# Patient Record
Sex: Female | Born: 1975 | Hispanic: Yes | Marital: Married | Smoking: Never smoker
Health system: Southern US, Community
[De-identification: ages and names within clinical notes are randomized; demographics above are authoritative.]

## PROBLEM LIST (undated history)

## (undated) DIAGNOSIS — E079 Disorder of thyroid, unspecified: Secondary | ICD-10-CM

## (undated) HISTORY — PX: OOPHORECTOMY: SHX86

## (undated) HISTORY — PX: KNEE SURGERY: SHX244

## (undated) HISTORY — PX: CYST EXCISION: SHX5701

---

## 2012-08-11 ENCOUNTER — Emergency Department
Admission: EM | Admit: 2012-08-11 | Discharge: 2012-08-11 | Disposition: A | Discharge status: Home / Self Care | Payer: 59 | Source: Home / Self Care | Attending: Family Medicine | Admitting: Family Medicine

## 2012-08-11 ENCOUNTER — Encounter: Payer: Self-pay | Admitting: Emergency Medicine

## 2012-08-11 DIAGNOSIS — J111 Influenza due to unidentified influenza virus with other respiratory manifestations: Secondary | ICD-10-CM

## 2012-08-11 MED ORDER — OSELTAMIVIR PHOSPHATE 75 MG PO CAPS: 75.0000 mg | ORAL_CAPSULE | Freq: Two times a day (BID) | ORAL | Status: DC

## 2012-08-11 MED ORDER — BENZONATATE 200 MG PO CAPS: 200.0000 mg | ORAL_CAPSULE | Freq: Every day | ORAL | Status: DC

## 2012-08-11 NOTE — ED Notes (Signed)
Sore throat, fever, chills, cough x 4 days

## 2012-08-11 NOTE — ED Provider Notes (Signed)
History     CSN: [id number]  Arrival date & time 08/11/12  7829   First MD Initiated Contact with Patient 08/11/12 [phone]      Chief Complaint  Patient presents with  . Sore Throat      HPI Comments: Patient complains of sore throat, fever, chills, cough for 3 days.  She has not had a flu shot this season.  The history is provided by the patient.    History reviewed. No pertinent past medical history.  History reviewed. No pertinent past surgical history.  Family History  Problem Relation Age of Onset  . Diabetes Mother   . Diabetes Father     History  Substance Use Topics  . Smoking status: Never Smoker   . Smokeless tobacco: Not on file  . Alcohol Use: No    OB History    Grav Para Term Preterm Abortions TAB SAB Ect Mult Living                  Review of Systems + sore throat + cough No pleuritic pain No wheezing + nasal congestion + post-nasal drainage No sinus pain/pressure No itchy/red eyes ? earache No hemoptysis No SOB No fever, + chills No nausea No vomiting No abdominal pain No diarrhea No urinary symptoms No skin rashes + fatigue No myalgias + headache Used OTC meds without relief  Allergies  Review of patient's allergies indicates no known allergies.  Home Medications   Current Outpatient Rx  Name  Route  Sig  Dispense  Refill  . PSEUDOEPH-DOXYLAMINE-DM-APAP 60-7.01-09-999 MG/30ML PO LIQD   Oral   Take by mouth.         . BENZONATATE 200 MG PO CAPS   Oral   Take 1 capsule (200 mg total) by mouth at bedtime. Take as needed for cough   12 capsule   0   . OSELTAMIVIR PHOSPHATE 75 MG PO CAPS   Oral   Take 1 capsule (75 mg total) by mouth every 12 (twelve) hours.   10 capsule   0     BP 114/81  Pulse 79  Temp 98 F (36.7 C) (Oral)  Resp 16  Ht 5\' 3"  (1.6 m)  Wt 165 lb (74.844 kg)  BMI 29.23 kg/m2  SpO2 100%  Physical Exam Nursing notes and Vital Signs reviewed. Appearance:  Patient appears healthy, stated  age, and in no acute distress Eyes:  Pupils are equal, round, and reactive to light and accomodation.  Extraocular movement is intact.  Conjunctivae are not inflamed  Ears:  Canals normal.  Tympanic membranes normal.  Nose:  Mildly congested turbinates.  No sinus tenderness.   Pharynx:  Normal Neck:  Supple.  Slightly enlarged shotty posterior nodes are palpated bilaterally  Lungs:  Clear to auscultation.  Breath sounds are equal.  Heart:  Regular rate and rhythm without murmurs, rubs, or gallops.  Abdomen:  Nontender without masses or hepatosplenomegaly.  Bowel sounds are present.  No CVA or flank tenderness.  Extremities:  No edema.  No calf tenderness Skin:  No rash present.   ED Course  Procedures    Labs Reviewed - POCT rapid strep negative    1. Influenza-like illness       MDM  Begin Tamiflu.  Prescription written for Benzonatate [facility]) to take at bedtime for night-time cough.  Take Mucinex D (guaifenesin with decongestant) twice daily for congestion.  Increase fluid intake, rest. May use Afrin nasal spray (or generic oxymetazoline) twice daily for  about 5 days.  Also recommend using saline nasal spray several times daily and saline nasal irrigation (AYR is a common brand) Stop all antihistamines for now, and other non-prescription cough/cold preparations. May take Ibuprofen 200mg , 4 tabs every 8 hours with food for sore throat, fever, etc. Recommend flu shot when well. Follow-up with family doctor if not improving one week.        [name], MD 08/11/12 [phone]

## 2015-03-15 ENCOUNTER — Emergency Department
Admission: EM | Admit: 2015-03-15 | Discharge: 2015-03-15 | Disposition: A | Discharge status: Home / Self Care | Payer: 59 | Source: Home / Self Care | Attending: Family Medicine | Admitting: Family Medicine

## 2015-03-15 ENCOUNTER — Emergency Department (INDEPENDENT_AMBULATORY_CARE_PROVIDER_SITE_OTHER): Payer: 59

## 2015-03-15 DIAGNOSIS — S83412A Sprain of medial collateral ligament of left knee, initial encounter: Secondary | ICD-10-CM

## 2015-03-15 DIAGNOSIS — M25562 Pain in left knee: Secondary | ICD-10-CM

## 2015-03-15 MED ORDER — MELOXICAM 15 MG PO TABS: 15.0000 mg | ORAL_TABLET | Freq: Every day | ORAL | Status: DC

## 2015-03-15 NOTE — ED Provider Notes (Signed)
CSN: [id number]     Arrival date & time 03/15/15  1034 History   First MD Initiated Contact with Patient 03/15/15 1054     Chief Complaint  Patient presents with  . Knee Pain    left      HPI Comments: Patient complains of intermittent pain in her left knee for four months, now constant and increased for two days.  No history of injury or change in physical activities.  No improvement with ibuprofen .  Patient is a 39 y.o. female presenting with knee pain. The history is provided by the patient.  Knee Pain Location:  Knee Time since incident:  4 months Injury: no   Knee location:  L knee Pain details:    Quality:  Aching   Radiates to:  Does not radiate   Severity:  Moderate   Onset quality:  Gradual   Duration:  4 months   Timing:  Constant   Progression:  Worsening Chronicity:  New Prior injury to area:  No Relieved by:  Nothing Worsened by:  Bearing weight, flexion and extension Ineffective treatments:  NSAIDs Associated symptoms: numbness and stiffness   Associated symptoms: no back pain, no decreased ROM, no fever, no muscle weakness, no swelling and no tingling     No past medical history on file. No past surgical history on file. Family History  Problem Relation Age of Onset  . Diabetes Mother   . Diabetes Father    History  Substance Use Topics  . Smoking status: Never Smoker   . Smokeless tobacco: Not on file  . Alcohol Use: No   OB History    No data available     Review of Systems  Constitutional: Negative for fever.  Musculoskeletal: Positive for stiffness. Negative for back pain.  All other systems reviewed and are negative.   Allergies  Review of patient's allergies indicates no known allergies.  Home Medications   Prior to Admission medications   Medication Sig Start Date End Date Taking? Authorizing Provider  meloxicam (MOBIC) 15 MG tablet Take 1 tablet (15 mg total) by mouth daily. Take with food each morning 03/15/15   [name], MD   BP 123/82 mmHg  Pulse 70  Wt 169 lb (76.658 kg)  SpO2 98%  LMP 01/25/2015 Physical Exam  Constitutional: She is oriented to person, place, and time. She appears well-developed and well-nourished.  HENT:  Head: Atraumatic.  Eyes: Pupils are equal, round, and reactive to light.  Musculoskeletal:       Left knee: She exhibits bony tenderness. She exhibits normal range of motion, no swelling, no effusion, no ecchymosis, no deformity, no laceration, no erythema, normal alignment, no LCL laxity, normal patellar mobility and normal meniscus. Tenderness found. Medial joint line and MCL tenderness noted. No lateral joint line, no LCL and no patellar tendon tenderness noted.       Legs: Left knee has full range of motion.  No swelling, erythema or warmth.  Knee stable; negative McMurray test.  There is tenderness to palpation over the medial joint line and MCL.  Pain is elicited medially with valgus stress.  Neurological: She is alert and oriented to person, place, and time.  Skin: Skin is warm and dry. No rash noted.  Nursing note and vitals reviewed.   ED Course  Procedures  none  Imaging Review Dg Knee Complete 4 Views Left  03/15/2015   CLINICAL DATA:  39 year old female with intermittent left knee pain for 4 months.  Constant for the past 2 days. No injury. Initial encounter.  EXAM: LEFT KNEE - COMPLETE 4+ VIEW  COMPARISON:  None.  FINDINGS: Minimal medial tibiofemoral joint space narrowing.  No fracture or dislocation.  Questionable tiny suprapatellar joint effusion.  IMPRESSION: Minimal medial tibiofemoral joint space narrowing.  Questionable tiny suprapatellar joint effusion   Electronically Signed   By: Lacy Duverney M.D.   On: 03/15/2015 11:56     MDM   1. Medial collateral ligament sprain of knee, left, initial encounter, etiology unclear.     Dispensed crutches.  Hinged knee brace applied.  Begin Mobic 15mg  daily. Apply ice pack for 30 minutes 3 to 4 times daily until  pain decreases.  Elevate when possible.  Use crutches for 3 to 5 days.  Wear brace for about 2 to 3 weeks.  Begin range of motion and stretching exercises in about 5 days as per instruction sheet.  May take Tylenol at bedtime as needed for pain. Followup with Dr. Rodney Langton or Dr. Clementeen [facility] ([facility]) in one week.    [name], MD 03/15/15 1235

## 2015-03-15 NOTE — ED Notes (Signed)
C/O left knee pain, no injury , front of knee x 2days

## 2015-03-15 NOTE — Discharge Instructions (Signed)
Apply ice pack for 30 minutes 3 to 4 times daily until pain decreases.  Elevate when possible.  Use crutches for 3 to 5 days.  Wear brace for about 2 to 3 weeks.  Begin range of motion and stretching exercises in about 5 days as per instruction sheet.  May take Tylenol at bedtime as needed for pain.

## 2015-03-17 ENCOUNTER — Ambulatory Visit (INDEPENDENT_AMBULATORY_CARE_PROVIDER_SITE_OTHER): Payer: 59 | Admitting: Family Medicine

## 2015-03-17 ENCOUNTER — Encounter: Payer: Self-pay | Admitting: Family Medicine

## 2015-03-17 VITALS — BP 115/73 | HR 71 | Ht 63.0 in | Wt 170.0 lb

## 2015-03-17 DIAGNOSIS — M25562 Pain in left knee: Secondary | ICD-10-CM | POA: Diagnosis not present

## 2015-03-17 MED ORDER — ALPRAZOLAM 0.25 MG PO TABS: ORAL_TABLET | ORAL | Status: DC

## 2015-03-17 NOTE — Patient Instructions (Signed)
Thank you for coming in today. Get the MRI.  Return a few days after the MRI.  If you do not hear from the MRI people please call me.   Meniscus Injury, Arthroscopy Arthroscopy is a surgical procedure that involves the use of a small scope that has a camera and surgical instruments on the end (arthroscope). An arthroscope can be used to repair your meniscus injury.  LET [facility] CARE PROVIDER KNOW ABOUT:  Any allergies you have.  All medicines you are taking, including vitamins, herbs, eyedrops, creams, and over-the-counter medicines.  Any recent colds or infections you have had or currently have.  Previous problems you or members of your family have had with the use of anesthetics.  Any blood disorders or blood clotting problems you have.  Previous surgeries you have had.  Medical conditions you have. RISKS AND COMPLICATIONS Generally, this is a safe procedure. However, as with any procedure, problems can occur. Possible problems include:  Damage to nerves or blood vessels.  Excess bleeding.  Blood clots.  Infection. BEFORE THE PROCEDURE  Do not eat or drink for 6-8 hours before the procedure.  Take medicines as directed by your surgeon. Ask your surgeon about changing or stopping your regular medicines.  You may have lab tests the morning of surgery. PROCEDURE  You will be given one of the following:   A medicine that numbs the area (local anesthesia).  A medicine that makes you go to sleep (general anesthesia).  A medicine injected into your spine that numbs your body below the waist (spinal anesthesia). Most often, several small cuts (incisions) are made in the knee. The arthroscope and instruments go into the incisions to repair the damage. The torn portion of the meniscus is removed.  During this time, your surgeon may find a partial or complete tear in a cruciate ligament, such as the anterior cruciate ligament (ACL). A completely torn cruciate ligament is  reconstructed by taking tissue from another part of the body (grafting) and placing it into the injured area. This requires several larger incisions to complete the repair. Sometimes, open surgery is needed for collateral ligament injuries. If a collateral ligament is found to be injured, your surgeon may staple or suture the tear through a slightly larger incision on the side of the knee. AFTER THE PROCEDURE You will be taken to the recovery area where your progress will be monitored. When you are awake, stable, and taking fluids without complications, you will be allowed to go home. This is usually the same day. However, more extensive repairs of a ligament may require an overnight stay.  The recovery time after repairing your meniscus or ligament depends on the amount of damage to these structures. It also depends on whether or not reconstructive knee surgery was needed.   A torn or stretched ligament (ligament sprain) may take 6-8 weeks to heal. It takes about the same amount of time if your surgeon removed a torn meniscus.  A repaired meniscus may require 6-12 weeks of recovery time.  A torn ligament needing reconstructive surgery may take 6-12 months to heal fully. Document Released: 07/27/2000 Document Revised: 08/04/2013 Document Reviewed: 12/26/2012 [facility] Patient Information 2015 [place], [place]. This information is not intended to replace advice given to you by your health care provider. Make sure you discuss any questions you have with your health care provider.

## 2015-03-17 NOTE — Addendum Note (Signed)
Addended by: Rodolph Bong on: 03/17/2015 03:52 PM   Modules accepted: Level of Service

## 2015-03-17 NOTE — Progress Notes (Signed)
   Subjective:    I'm seeing this patient as a consultation for:  Dr. Cathren Harsh  CC: Left Knee Pain  HPI: Patient is a 4 month history of intermittent left knee pain. Pain worsened about a week ago without injury. Pain is located in the medial aspect of the knee is associated with intermittent swelling. She was seen in urgent care on the second where x-rays showed mild possible degenerative changes along the medial aspect of the knee. She notes popping of the knee but denies any locking or catching. She was given brace crutches and meloxicam which have helped. No fevers chills nausea vomiting diarrhea or radiating pain. Patient has tried home exercises/physical therapy which has not helped much.  Past medical history, Surgical history, Family history not pertinant except as noted below, Social history, Allergies, and medications have been entered into the medical record, reviewed, and no changes needed.   Review of Systems: No headache, visual changes, nausea, vomiting, diarrhea, constipation, dizziness, abdominal pain, skin rash, fevers, chills, night sweats, weight loss, swollen lymph nodes, body aches, joint swelling, muscle aches, chest pain, shortness of breath, mood changes, visual or auditory hallucinations.   Objective:    Filed Vitals:   03/17/15 1424  BP: 115/73  Pulse: 71    General: Well Developed, well nourished, and in no acute distress.  Neuro/Psych: Alert and oriented x3, extra-ocular muscles intact, able to move all 4 extremities, sensation grossly intact. Skin: Warm and dry, no rashes noted.  Respiratory: Not using accessory muscles, speaking in full sentences, trachea midline.  Cardiovascular: Pulses palpable, no extremity edema. Abdomen: Does not appear distended. MSK: Left knee normal-appearing. Range of motion 0-120 with 1+ crepitations. Tender palpation medial joint line. Stable ligamentous exam with normal valgus and varus stress with negative Lachmans and anterior  and posterior drawer testing. Medial McMurray's test is positive lateral is negative.  No results found for this or any previous visit (from the past 24 hour(s)). No results found.  Impression and Recommendations:   This case required medical decision making of moderate complexity.

## 2015-03-17 NOTE — Assessment & Plan Note (Signed)
Persistent intermittent left knee pain. This is concerning for medial meniscus injury. This patient has had long duration of symptoms with relatively normal x-rays and trial of conservative measures have failed including some home exercises, I feel is reasonable to proceed with MRI. Patient will return after MRI of knee as we performed to discuss plan

## 2015-03-21 ENCOUNTER — Ambulatory Visit (INDEPENDENT_AMBULATORY_CARE_PROVIDER_SITE_OTHER): Payer: 59

## 2015-03-21 DIAGNOSIS — M25562 Pain in left knee: Secondary | ICD-10-CM

## 2015-03-21 DIAGNOSIS — M1712 Unilateral primary osteoarthritis, left knee: Secondary | ICD-10-CM | POA: Diagnosis not present

## 2015-03-21 NOTE — Progress Notes (Signed)
Quick Note:  MRI shows a meniscus tear like we were afraid of. This should be fixed by an orthopedic surgeon. I can refer to [facility] or [place]. Asked the patient where she wants to go ______

## 2016-04-20 ENCOUNTER — Encounter: Payer: Self-pay | Admitting: Emergency Medicine

## 2016-04-20 ENCOUNTER — Emergency Department
Admission: EM | Admit: 2016-04-20 | Discharge: 2016-04-20 | Disposition: A | Discharge status: Home / Self Care | Payer: BLUE CROSS/BLUE SHIELD | Source: Home / Self Care | Attending: Family Medicine | Admitting: Family Medicine

## 2016-04-20 DIAGNOSIS — R6889 Other general symptoms and signs: Secondary | ICD-10-CM

## 2016-04-20 LAB — POCT INFLUENZA A/B
Influenza A, POC: NEGATIVE
Influenza B, POC: NEGATIVE

## 2016-04-20 NOTE — Discharge Instructions (Signed)

## 2016-04-20 NOTE — ED Provider Notes (Signed)
CSN: [id number]     Arrival date & time 04/20/16  1031 History   First MD Initiated Contact with Patient 04/20/16 1050     Chief Complaint  Patient presents with  . Fever   (Consider location/radiation/quality/duration/timing/severity/associated sxs/prior Treatment) HPI  Morgan Kelley is a 40 y.o. female presenting to UC with c/o sudden onset fever, chills, body aches, generalized weakness and nausea after eating that started yesterday.  Body aches and generalized headache are most bothersome for pt at this time, pain is moderate in severity.  She reports subjective fever with hot and cold chills last night. She has not taken anything for her symptoms. Denies sick contacts or recent travel.    History reviewed. No pertinent past medical history. History reviewed. No pertinent surgical history. Family History  Problem Relation Age of Onset  . Diabetes Mother   . Diabetes Father    Social History  Substance Use Topics  . Smoking status: Never Smoker  . Smokeless tobacco: Never Used  . Alcohol use No   OB History    No data available     Review of Systems  Constitutional: Positive for chills, fatigue and fever.  HENT: Positive for congestion. Negative for rhinorrhea, sore throat and voice change.   Respiratory: Negative for cough, shortness of breath and wheezing.   Gastrointestinal: Positive for nausea. Negative for abdominal pain, diarrhea and vomiting.  Musculoskeletal: Positive for arthralgias and myalgias.       Body aches  Neurological: Positive for weakness ( generalized) and headaches. Negative for dizziness and light-headedness.    Allergies  Review of patient's allergies indicates no known allergies.  Home Medications   Prior to Admission medications   Medication Sig Start Date End Date Taking? Authorizing Provider  ALPRAZolam [name]) 0.25 MG tablet 1-2 tablets prior to MRI for claustrophobia 03/17/15   Rodolph Bong, MD  ibuprofen (ADVIL,MOTRIN) 200 MG tablet Take  200 mg by mouth every 6 (six) hours as needed.    Historical Provider, MD   Meds Ordered and Administered this Visit  Medications - No data to display  BP 121/85 (BP Location: Left Arm)   Pulse 81   Temp 98.3 F (36.8 C) (Oral)   Ht 5\' 3"  (1.6 m)   Wt 175 lb (79.4 kg)   SpO2 98%   BMI 31.00 kg/m  No data found.   Physical Exam  Constitutional: She appears well-developed and well-nourished. No distress.  HENT:  Head: Normocephalic and atraumatic.  Right Ear: Tympanic membrane normal.  Left Ear: Tympanic membrane normal.  Nose: Nose normal.  Mouth/Throat: Uvula is midline, oropharynx is clear and moist and mucous membranes are normal.  Eyes: Conjunctivae are normal. No scleral icterus.  Neck: Normal range of motion. Neck supple.  Cardiovascular: Normal rate, regular rhythm and normal heart sounds.   Pulmonary/Chest: Effort normal and breath sounds normal. No respiratory distress. She has no wheezes. She has no rales.  Abdominal: Soft. She exhibits no distension. There is no tenderness.  Musculoskeletal: Normal range of motion.  Neurological: She is alert.  Skin: Skin is warm and dry. She is not diaphoretic.  Nursing note and vitals reviewed.   Urgent Care Course   Clinical Course    Procedures (including critical care time)  Labs Review Labs Reviewed  POCT INFLUENZA A/B    Imaging Review No results found.   MDM   1. Flu-like symptoms    Pt c/o flu-like symptoms that started yesterday, headache and body aches most bothersome for  pt. No sick contacts.  Rapid flu: NEGATIVE   Symptoms likely viral in nature. Advised pt to use acetaminophen and ibuprofen as needed for fever and pain. Encouraged rest and fluids. F/u with PCP in 7-10 days if not improving, sooner if worsening. Pt verbalized understanding and agreement with tx plan.     Junius Finnerrin O'Malley, PA-C 04/20/16 1112

## 2016-04-20 NOTE — ED Triage Notes (Signed)
Fever, chills, body aches, weakness, nausea after eating, started yesterday

## 2016-12-27 ENCOUNTER — Emergency Department
Admission: EM | Admit: 2016-12-27 | Discharge: 2016-12-27 | Disposition: A | Discharge status: Home / Self Care | Payer: BLUE CROSS/BLUE SHIELD | Source: Home / Self Care | Attending: Family Medicine | Admitting: Family Medicine

## 2016-12-27 ENCOUNTER — Encounter: Payer: Self-pay | Admitting: Emergency Medicine

## 2016-12-27 DIAGNOSIS — J029 Acute pharyngitis, unspecified: Secondary | ICD-10-CM

## 2016-12-27 DIAGNOSIS — J302 Other seasonal allergic rhinitis: Secondary | ICD-10-CM

## 2016-12-27 HISTORY — DX: Disorder of thyroid, unspecified: E07.9

## 2016-12-27 LAB — POCT RAPID STREP A (OFFICE): RAPID STREP A SCREEN: NEGATIVE

## 2016-12-27 MED ORDER — AMOXICILLIN 875 MG PO TABS
875.0000 mg | ORAL_TABLET | Freq: Two times a day (BID) | ORAL | 0 refills | Status: DC
Start: 2016-12-27 — End: 2017-03-03

## 2016-12-27 MED ORDER — MOMETASONE FUROATE 50 MCG/ACT NA SUSP: NASAL | 1 refills | Status: DC

## 2016-12-27 NOTE — ED Triage Notes (Signed)
Patient states there has been soreness in throat with sense of lump; notices some blood when rinsing mouth in a.m.; also itching of ears (wears ear plugs at work). Seasonal allergy meds not working.

## 2016-12-27 NOTE — ED Provider Notes (Signed)
[facility]    CSN: [id number] Arrival date & time: 12/27/16  1228     History   Chief Complaint Chief Complaint  Patient presents with  . Sore Throat  . Ear Problem    HPI Morgan JabsMaria Kelley is a 41 y.o. female.   Patient complains of three week history of gradually increasing sore throat, with vague sensation of lump in her throat.  She has mild sinus congestion and post-nasal drainage, and Allegra D is somewhat helpful.  She has noted some blood when she rinses her mouth each morning.  No facial pain.  No fevers, chills, and sweats.   The history is provided by the patient.    Past Medical History:  Diagnosis Date  . Thyroid disease    not taking meds due to problems with them    Patient Active Problem List   Diagnosis Date Noted  . Left knee pain 03/17/2015    Past Surgical History:  Procedure Laterality Date  . KNEE SURGERY      OB History    No data available       Home Medications    Prior to Admission medications   Medication Sig Start Date End Date Taking? Authorizing Provider  ALPRAZolam [name](XANAX) 0.25 MG tablet 1-2 tablets prior to MRI for claustrophobia 03/17/15   [name], [name], MD  amoxicillin (AMOXIL) 875 MG tablet Take 1 tablet (875 mg total) by mouth 2 (two) times daily. 12/27/16   [name], [name], MD  ibuprofen (ADVIL,MOTRIN) 200 MG tablet Take 200 mg by mouth every 6 (six) hours as needed.    [provider]  mometasone (NASONEX) 50 MCG/ACT nasal spray Place 2 sprays in each side of nose once daily 12/27/16   [name], Modelle Vollmer A, MD    Family History Family History  Problem Relation Age of Onset  . Diabetes Mother   . Diabetes Father     Social History Social History  Substance Use Topics  . Smoking status: Never Smoker  . Smokeless tobacco: Never Used  . Alcohol use No     Allergies   Patient has no known allergies.   Review of Systems Review of Systems + sore throat No cough No pleuritic pain No wheezing +  nasal congestion + post-nasal drainage No sinus pain/pressure No itchy/red eyes ? earache No hemoptysis No SOB No fever, + chills/sweats No nausea No vomiting No abdominal pain No diarrhea No urinary symptoms No skin rash + fatigue No myalgias No headache Used OTC meds without relief   Physical Exam Triage Vital Signs ED Triage Vitals  Enc Vitals Group     BP 12/27/16 1300 118/79     Pulse Rate 12/27/16 1300 66     Resp 12/27/16 1300 16     Temp 12/27/16 1300 98.4 F (36.9 C)     Temp Source 12/27/16 1300 Oral     SpO2 12/27/16 1300 100 %     Weight 12/27/16 1301 170 lb (77.1 kg)     Height 12/27/16 1301 5\' 3"  (1.6 m)     Head Circumference --      Peak Flow --      Pain Score 12/27/16 1301 2     Pain Loc --      Pain Edu? --      Excl. in GC? --    No data found.   Updated Vital Signs BP 118/79 (BP Location: Left Arm)   Pulse 66   Temp 98.4 F (36.9  C) (Oral)   Resp 16   Ht 5\' 3"  (1.6 m)   Wt 170 lb (77.1 kg)   LMP 12/18/2016 (Exact Date)   SpO2 100%   BMI 30.11 kg/m   Visual Acuity Right Eye Distance:   Left Eye Distance:   Bilateral Distance:    Right Eye Near:   Left Eye Near:    Bilateral Near:     Physical Exam Nursing notes and Vital Signs reviewed. Appearance:  Patient appears stated age, and in no acute distress Eyes:  Pupils are equal, round, and reactive to light and accomodation.  Extraocular movement is intact.  Conjunctivae are not inflamed  Ears:  Canals normal.  Tympanic membranes normal.  Nose:  Congested turbinates.  No sinus tenderness.   Pharynx:  Normal Neck:  Supple.  No adenopathy or thyromegaly. Lungs:  Clear to auscultation.  Breath sounds are equal.  Moving air well. Heart:  Regular rate and rhythm without murmurs, rubs, or gallops.  Abdomen:  Nontender without masses or hepatosplenomegaly.  Bowel sounds are present.  No CVA or flank tenderness.  Extremities:  No edema.  Skin:  No rash present.    UC Treatments /  Results  Labs (all labs ordered are listed, but only abnormal results are displayed) Labs Reviewed  POCT RAPID STREP A (OFFICE) negative    EKG  EKG Interpretation None       Radiology No results found.  Procedures Procedures (including critical care time)  Medications Ordered in UC Medications - No data to display   Initial Impression / Assessment and Plan / UC Course  I have reviewed the triage vital signs and the nursing notes.  Pertinent labs & imaging results that were available during my care of the patient were reviewed by me and considered in my medical decision making (see chart for details).    Will empirically begin amoxicillin, and Nasonex nasal spray. Continue Allegra D May use Afrin nasal spray (or generic oxymetazoline) each morning for about 5 days and then discontinue.  Also recommend using saline nasal spray several times daily and saline nasal irrigation (AYR is a common brand).  Use Nasonex nasal spray each morning after using Afrin nasal spray and saline nasal irrigation. Try warm salt water gargles for sore throat.  Followup with ENT if not improving two weeks.    Final Clinical Impressions(s) / UC Diagnoses   Final diagnoses:  Pharyngitis, unspecified etiology  Seasonal allergic rhinitis, unspecified trigger    New Prescriptions New Prescriptions   AMOXICILLIN (AMOXIL) 875 MG TABLET    Take 1 tablet (875 mg total) by mouth 2 (two) times daily.   MOMETASONE (NASONEX) 50 MCG/ACT NASAL SPRAY    Place 2 sprays in each side of nose once daily     [name], MD 12/27/16 [phone]

## 2016-12-27 NOTE — Discharge Instructions (Signed)
Continue Allegra D May use Afrin nasal spray (or generic oxymetazoline) each morning for about 5 days and then discontinue.  Also recommend using saline nasal spray several times daily and saline nasal irrigation (AYR is a common brand).  Use Nasonex nasal spray each morning after using Afrin nasal spray and saline nasal irrigation. Try warm salt water gargles for sore throat.

## 2017-03-03 ENCOUNTER — Encounter: Payer: Self-pay | Admitting: Emergency Medicine

## 2017-03-03 ENCOUNTER — Emergency Department (INDEPENDENT_AMBULATORY_CARE_PROVIDER_SITE_OTHER): Payer: BLUE CROSS/BLUE SHIELD

## 2017-03-03 ENCOUNTER — Emergency Department
Admission: EM | Admit: 2017-03-03 | Discharge: 2017-03-03 | Disposition: A | Discharge status: Home / Self Care | Payer: BLUE CROSS/BLUE SHIELD | Source: Home / Self Care | Attending: Family Medicine | Admitting: Family Medicine

## 2017-03-03 DIAGNOSIS — S93401A Sprain of unspecified ligament of right ankle, initial encounter: Secondary | ICD-10-CM | POA: Diagnosis not present

## 2017-03-03 DIAGNOSIS — M25571 Pain in right ankle and joints of right foot: Secondary | ICD-10-CM

## 2017-03-03 MED ORDER — AIRCAST SPORT ANKLE BRACE/RGHT MISC: 0 refills | Status: DC

## 2017-03-03 NOTE — ED Triage Notes (Signed)
Pt c/o right ankle pain, missed a step last night at work, ankle twisted inward, hurts to walk or apply pressure.

## 2017-03-03 NOTE — ED Provider Notes (Signed)
[facility]    CSN: [id number] Arrival date & time: 03/03/17  1331     History   Chief Complaint Chief Complaint  Patient presents with  . Ankle Pain    HPI Morgan Kelley is a 41 y.o. female.   Patient missed a step last night at work, and inverted her right ankle.  She has had persistent pain/swelling.   The history is provided by the patient.  Ankle Pain  Location:  Ankle Time since incident:  1 day Injury: yes   Mechanism of injury comment:  Inverted ankle Ankle location:  R ankle Pain details:    Quality:  Aching   Radiates to:  Does not radiate   Severity:  Moderate   Onset quality:  Sudden   Duration:  1 day   Timing:  Constant   Progression:  Unchanged Chronicity:  New Dislocation: no   Prior injury to area:  No Relieved by:  Nothing Worsened by:  Bearing weight Ineffective treatments:  Ice Associated symptoms: decreased ROM, stiffness and swelling   Associated symptoms: no muscle weakness, no numbness and no tingling     Past Medical History:  Diagnosis Date  . Thyroid disease    not taking meds due to problems with them    Patient Active Problem List   Diagnosis Date Noted  . Left knee pain 03/17/2015    Past Surgical History:  Procedure Laterality Date  . KNEE SURGERY      OB History    No data available       Home Medications    Prior to Admission medications   Not on File    Family History Family History  Problem Relation Age of Onset  . Diabetes Mother   . Diabetes Father     Social History Social History  Substance Use Topics  . Smoking status: Never Smoker  . Smokeless tobacco: Never Used  . Alcohol use No     Allergies   Patient has no known allergies.   Review of Systems Review of Systems  Musculoskeletal: Positive for stiffness.  All other systems reviewed and are negative.    Physical Exam Triage Vital Signs ED Triage Vitals  Enc Vitals Group     BP 03/03/17 1344 113/75     Pulse  Rate 03/03/17 1344 66     Resp --      Temp 03/03/17 1344 98.6 F (37 C)     Temp Source 03/03/17 1344 Oral     SpO2 03/03/17 1344 97 %     Weight 03/03/17 1345 173 lb 8 oz (78.7 kg)     Height 03/03/17 1345 5\' 3"  (1.6 m)     Head Circumference --      Peak Flow --      Pain Score 03/03/17 1345 5     Pain Loc --      Pain Edu? --      Excl. in GC? --    No data found.   Updated Vital Signs BP 113/75 (BP Location: Left Arm)   Pulse 66   Temp 98.6 F (37 C) (Oral)   Ht 5\' 3"  (1.6 m)   Wt 173 lb 8 oz (78.7 kg)   LMP 02/17/2017 (Exact Date)   SpO2 97%   BMI 30.73 kg/m   Visual Acuity Right Eye Distance:   Left Eye Distance:   Bilateral Distance:    Right Eye Near:   Left Eye Near:    Bilateral Near:  Physical Exam  Constitutional: She appears well-developed and well-nourished. No distress.  HENT:  Head: Atraumatic.  Eyes: Pupils are equal, round, and reactive to light.  Cardiovascular: Normal rate.   Pulmonary/Chest: Effort normal.  Musculoskeletal:       Right ankle: She exhibits decreased range of motion and swelling. She exhibits no ecchymosis, no deformity, no laceration and normal pulse. Tenderness. Lateral malleolus tenderness found. Achilles tendon normal.       Feet:  Right ankle:  Decreased range of motion.  Tenderness and swelling over the lateral malleolus.  Joint stable.  No tenderness over the base of the fifth metatarsal.  Distal neurovascular function is intact.   Neurological: She is alert.  Skin: Skin is warm and dry.  Nursing note and vitals reviewed.    UC Treatments / Results  Labs (all labs ordered are listed, but only abnormal results are displayed) Labs Reviewed - No data to display  EKG  EKG Interpretation None       Radiology Dg Ankle Complete Right  Result Date: 03/03/2017 CLINICAL DATA:  Missed a step last night and twisted RIGHT ankle, having RIGHT ankle pain and swelling laterally, initial encounter EXAM: RIGHT ANKLE  - COMPLETE 3+ VIEW COMPARISON:  None. FINDINGS: Osseous mineralization normal. Joint spaces preserved. No acute fracture, dislocation or bone destruction. Suspect RIGHT ankle joint effusion. IMPRESSION: Probable RIGHT ankle joint effusion without acute fracture or dislocation. Electronically Signed   By: Ulyses Southward M.D.   On: 03/03/2017 13:57    Procedures Procedures (including critical care time)  Medications Ordered in UC Medications - No data to display   Initial Impression / Assessment and Plan / UC Course  I have reviewed the triage vital signs and the nursing notes.  Pertinent labs & imaging results that were available during my care of the patient were reviewed by me and considered in my medical decision making (see chart for details).    Ace wrap applied. Rx written for AirCast stirrup splint. Apply ice pack for 20 minutes, 2 to 3 times daily.  Elevate.  Wear Ace wrap until swelling decreases.  Wear brace for about 2 to 3 weeks.  Begin range of motion and stretching exercises in about 5 days as per instruction sheet.  May take Ibuprofen 200mg , 4 tabs every 8 hours with food.  Followup with Dr. Rodney Langton or Dr. Clementeen [facility] ([facility]) if not improving about two weeks.     Final Clinical Impressions(s) / UC Diagnoses   Final diagnoses:  Sprain of right ankle, unspecified ligament, initial encounter    New Prescriptions New Prescriptions   No medications on file     [name], MD 03/03/17 [id number]

## 2017-03-03 NOTE — Discharge Instructions (Signed)
Apply ice pack for 20 minutes, 2 to 3 times daily.  Elevate.  Wear Ace wrap until swelling decreases.  Wear brace for about 2 to 3 weeks.  Begin range of motion and stretching exercises in about 5 days as per instruction sheet.  May take Ibuprofen 200mg , 4 tabs every 8 hours with food.

## 2017-04-16 ENCOUNTER — Emergency Department
Admission: EM | Admit: 2017-04-16 | Discharge: 2017-04-16 | Disposition: A | Discharge status: Home / Self Care | Payer: BLUE CROSS/BLUE SHIELD | Source: Home / Self Care | Attending: Family Medicine | Admitting: Family Medicine

## 2017-04-16 ENCOUNTER — Encounter: Payer: Self-pay | Admitting: Emergency Medicine

## 2017-04-16 DIAGNOSIS — J392 Other diseases of pharynx: Secondary | ICD-10-CM

## 2017-04-16 MED ORDER — CETIRIZINE HCL 10 MG PO TABS: 10.0000 mg | ORAL_TABLET | Freq: Every day | ORAL | 1 refills | Status: DC

## 2017-04-16 MED ORDER — PREDNISONE 20 MG PO TABS: ORAL_TABLET | ORAL | 0 refills | Status: DC

## 2017-04-16 NOTE — ED Provider Notes (Signed)
[facility]    CSN: [id number] Arrival date & time: 04/16/17  1423     History   Chief Complaint Chief Complaint  Patient presents with  . Allergies    HPI Morgan Kelley is a 41 y.o. female.   HPI  Morgan Kelley is a 41 y.o. female presenting to UC with c/o rhinorrhea, dry cough, itchy irritated throat but denies throat pain. She reports hx of sinus infection last week but those symptoms resolved on their own.  She has been taking Allegra and mucinex occasionally but no relief of throat irritation.  Denies fever, chills, n/v/d. Denies difficulty swallowing or breathing. No known sick contacts.    Past Medical History:  Diagnosis Date  . Thyroid disease    not taking meds due to problems with them    Patient Active Problem List   Diagnosis Date Noted  . Left knee pain 03/17/2015    Past Surgical History:  Procedure Laterality Date  . KNEE SURGERY      OB History    No data available       Home Medications    Prior to Admission medications   Medication Sig Start Date End Date Taking? Authorizing Provider  cetirizine (ZYRTEC) 10 MG tablet Take 1 tablet (10 mg total) by mouth daily. 04/16/17   Lurene ShadowPhelps, Dennison Mcdaid O, PA-C  Elastic Bandages & Supports (AIRCAST SPORT ANKLE BRACE/RGHT) MISC Stirrup splint:  Wear daily for 2 to 3 weeks until healed. 03/03/17   [name], Stephen A, MD  predniSONE (DELTASONE) 20 MG tablet 3 tabs po day one, then 2 po daily x 4 days 04/16/17   Lurene ShadowPhelps, Jamise Pentland O, PA-C    Family History Family History  Problem Relation Age of Onset  . Diabetes Mother   . Diabetes Father     Social History Social History  Substance Use Topics  . Smoking status: Never Smoker  . Smokeless tobacco: Never Used  . Alcohol use No     Allergies   Patient has no known allergies.   Review of Systems Review of Systems  Constitutional: Negative for chills and fever.  HENT: Positive for postnasal drip, rhinorrhea and sore throat. Negative for congestion, ear  pain, trouble swallowing and voice change.   Respiratory: Negative for cough and shortness of breath.   Cardiovascular: Negative for chest pain and palpitations.  Gastrointestinal: Negative for abdominal pain, diarrhea, nausea and vomiting.  Musculoskeletal: Negative for arthralgias, back pain and myalgias.  Skin: Negative for rash.  Neurological: Negative for dizziness, light-headedness and headaches.     Physical Exam Triage Vital Signs ED Triage Vitals  Enc Vitals Group     BP 04/16/17 1440 107/76     Pulse Rate 04/16/17 1440 80     Resp --      Temp 04/16/17 1440 98.1 F (36.7 C)     Temp Source 04/16/17 1440 Oral     SpO2 04/16/17 1440 99 %     Weight 04/16/17 1441 173 lb (78.5 kg)     Height 04/16/17 1441 5\' 3"  (1.6 m)     Head Circumference --      Peak Flow --      Pain Score 04/16/17 1441 0     Pain Loc --      Pain Edu? --      Excl. in GC? --    No data found.   Updated Vital Signs BP 107/76 (BP Location: Left Arm)   Pulse 80   Temp 98.1  F (36.7 C) (Oral)   Ht 5\' 3"  (1.6 m)   Wt 173 lb (78.5 kg)   SpO2 99%   BMI 30.65 kg/m     Physical Exam  Constitutional: She is oriented to person, place, and time. She appears well-developed and well-nourished. No distress.  HENT:  Head: Normocephalic and atraumatic.  Right Ear: Tympanic membrane normal.  Left Ear: Tympanic membrane normal.  Nose: Nose normal.  Mouth/Throat: Uvula is midline and mucous membranes are normal. Posterior oropharyngeal erythema ( minimal) present. No oropharyngeal exudate, posterior oropharyngeal edema or tonsillar abscesses.  Eyes: EOM are normal.  Neck: Normal range of motion. Neck supple.  Cardiovascular: Normal rate and regular rhythm.   Pulmonary/Chest: Effort normal and breath sounds normal. No respiratory distress. She has no wheezes. She has no rales.  Musculoskeletal: Normal range of motion.  Neurological: She is alert and oriented to person, place, and time.  Skin: Skin is  warm and dry. She is not diaphoretic.  Psychiatric: She has a normal mood and affect. Her behavior is normal.  Nursing note and vitals reviewed.    UC Treatments / Results  Labs (all labs ordered are listed, but only abnormal results are displayed) Labs Reviewed - No data to display  EKG  EKG Interpretation None       Radiology No results found.  Procedures Procedures (including critical care time)  Medications Ordered in UC Medications - No data to display   Initial Impression / Assessment and Plan / UC Course  I have reviewed the triage vital signs and the nursing notes.  Pertinent labs & imaging results that were available during my care of the patient were reviewed by me and considered in my medical decision making (see chart for details).     Pt c/o throat irritation Hx of allergies and recent URI symptoms  Symptoms most likely due to seasonal allergies.  Exam not concerning for bacterial infection, doubt viral illness as pt is afebrile, denies throat pain or fever.   Final Clinical Impressions(s) / UC Diagnoses   Final diagnoses:  Throat irritation    New Prescriptions Discharge Medication List as of 04/16/2017  2:48 PM    START taking these medications   Details  cetirizine (ZYRTEC) 10 MG tablet Take 1 tablet (10 mg total) by mouth daily., Starting Tue 04/16/2017, Normal    predniSONE (DELTASONE) 20 MG tablet 3 tabs po day one, then 2 po daily x 4 days, Normal         Controlled Substance Prescriptions [place] Controlled Substance Registry consulted? Not Applicable   Rolla Plate 04/16/17 1191

## 2017-04-16 NOTE — ED Triage Notes (Signed)
Allergies, runny nose, cough, dry, itchy, weird throat intermittent all the time.

## 2017-11-19 IMAGING — DX DG ANKLE COMPLETE 3+V*R*
3 series · 3 of 3 positions shown · non-contrast
Comparison: None.

CLINICAL DATA: Missed a step last night and twisted RIGHT ankle,
having RIGHT ankle pain and swelling laterally, initial encounter

EXAM:
RIGHT ANKLE - COMPLETE 3+ VIEW

[ankle ap]
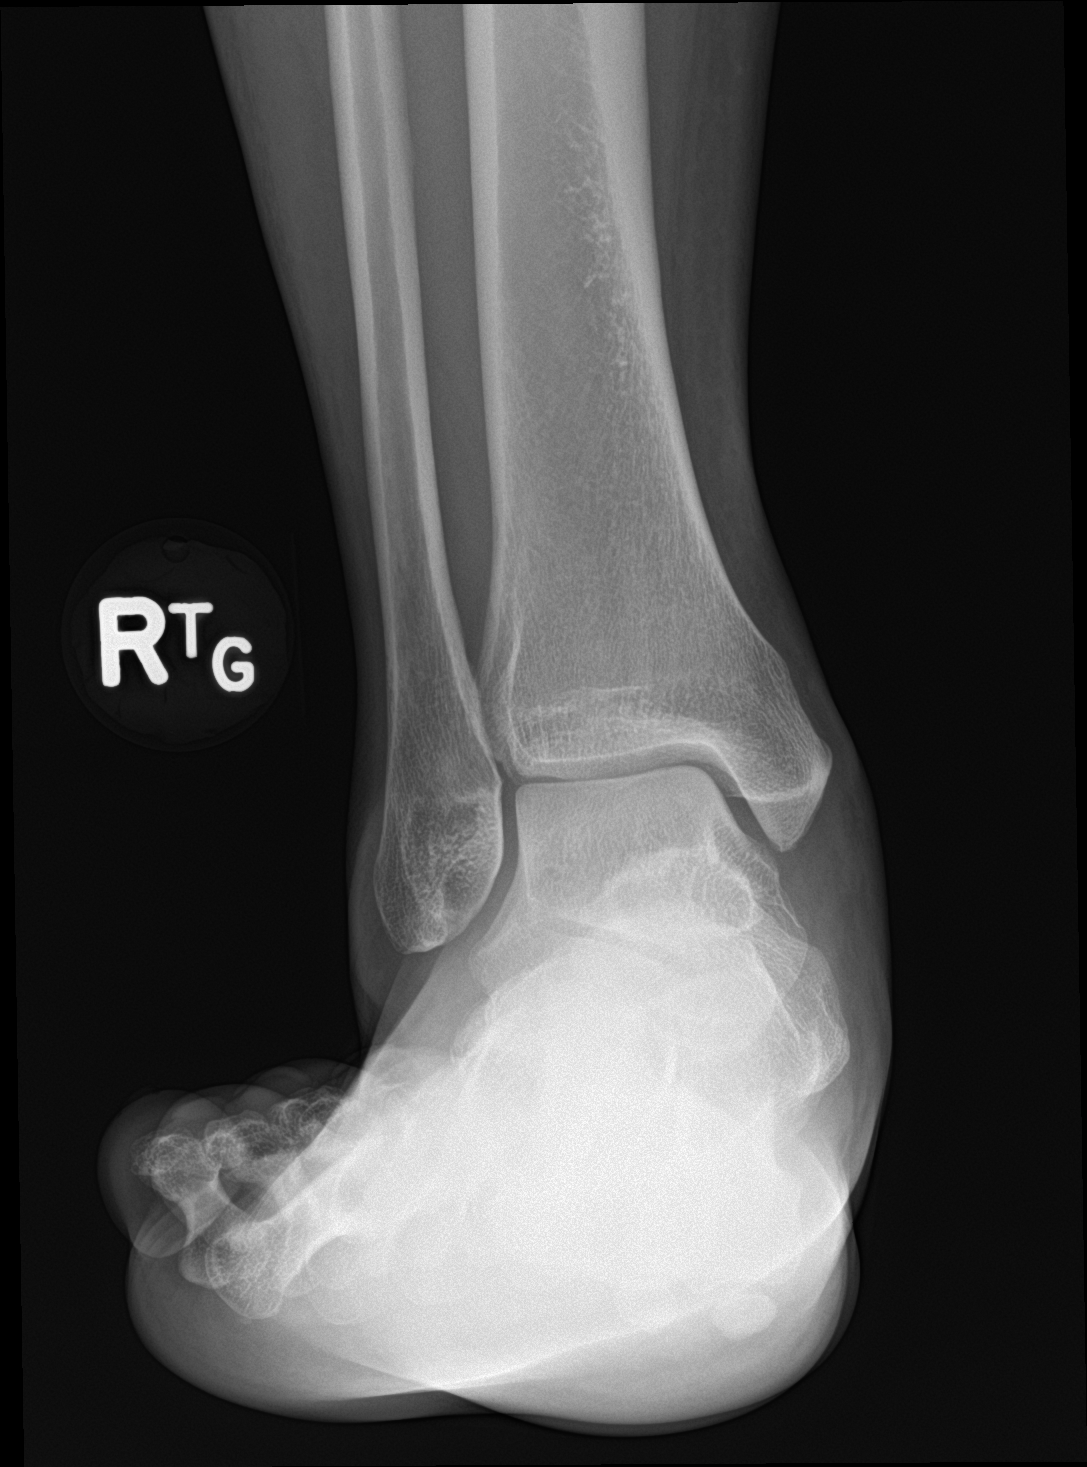

[ankle obl]
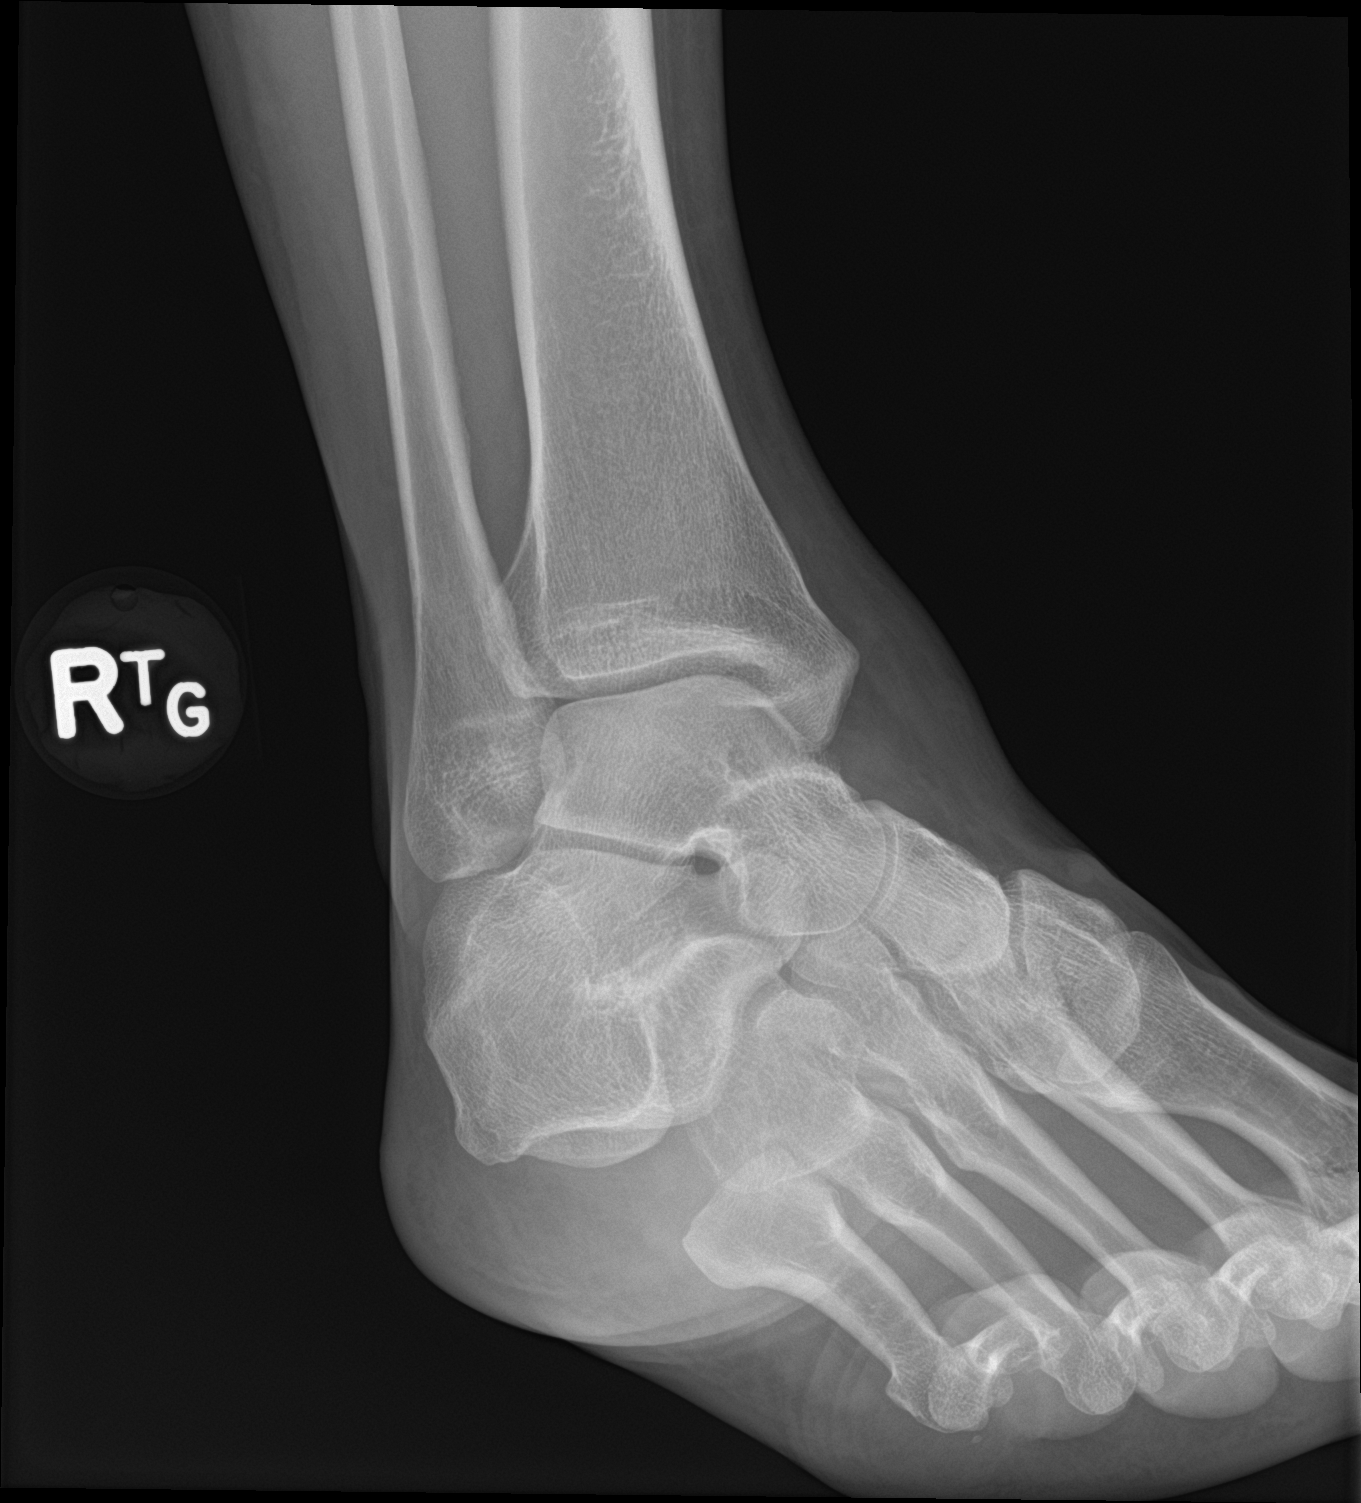

[ankle lat]
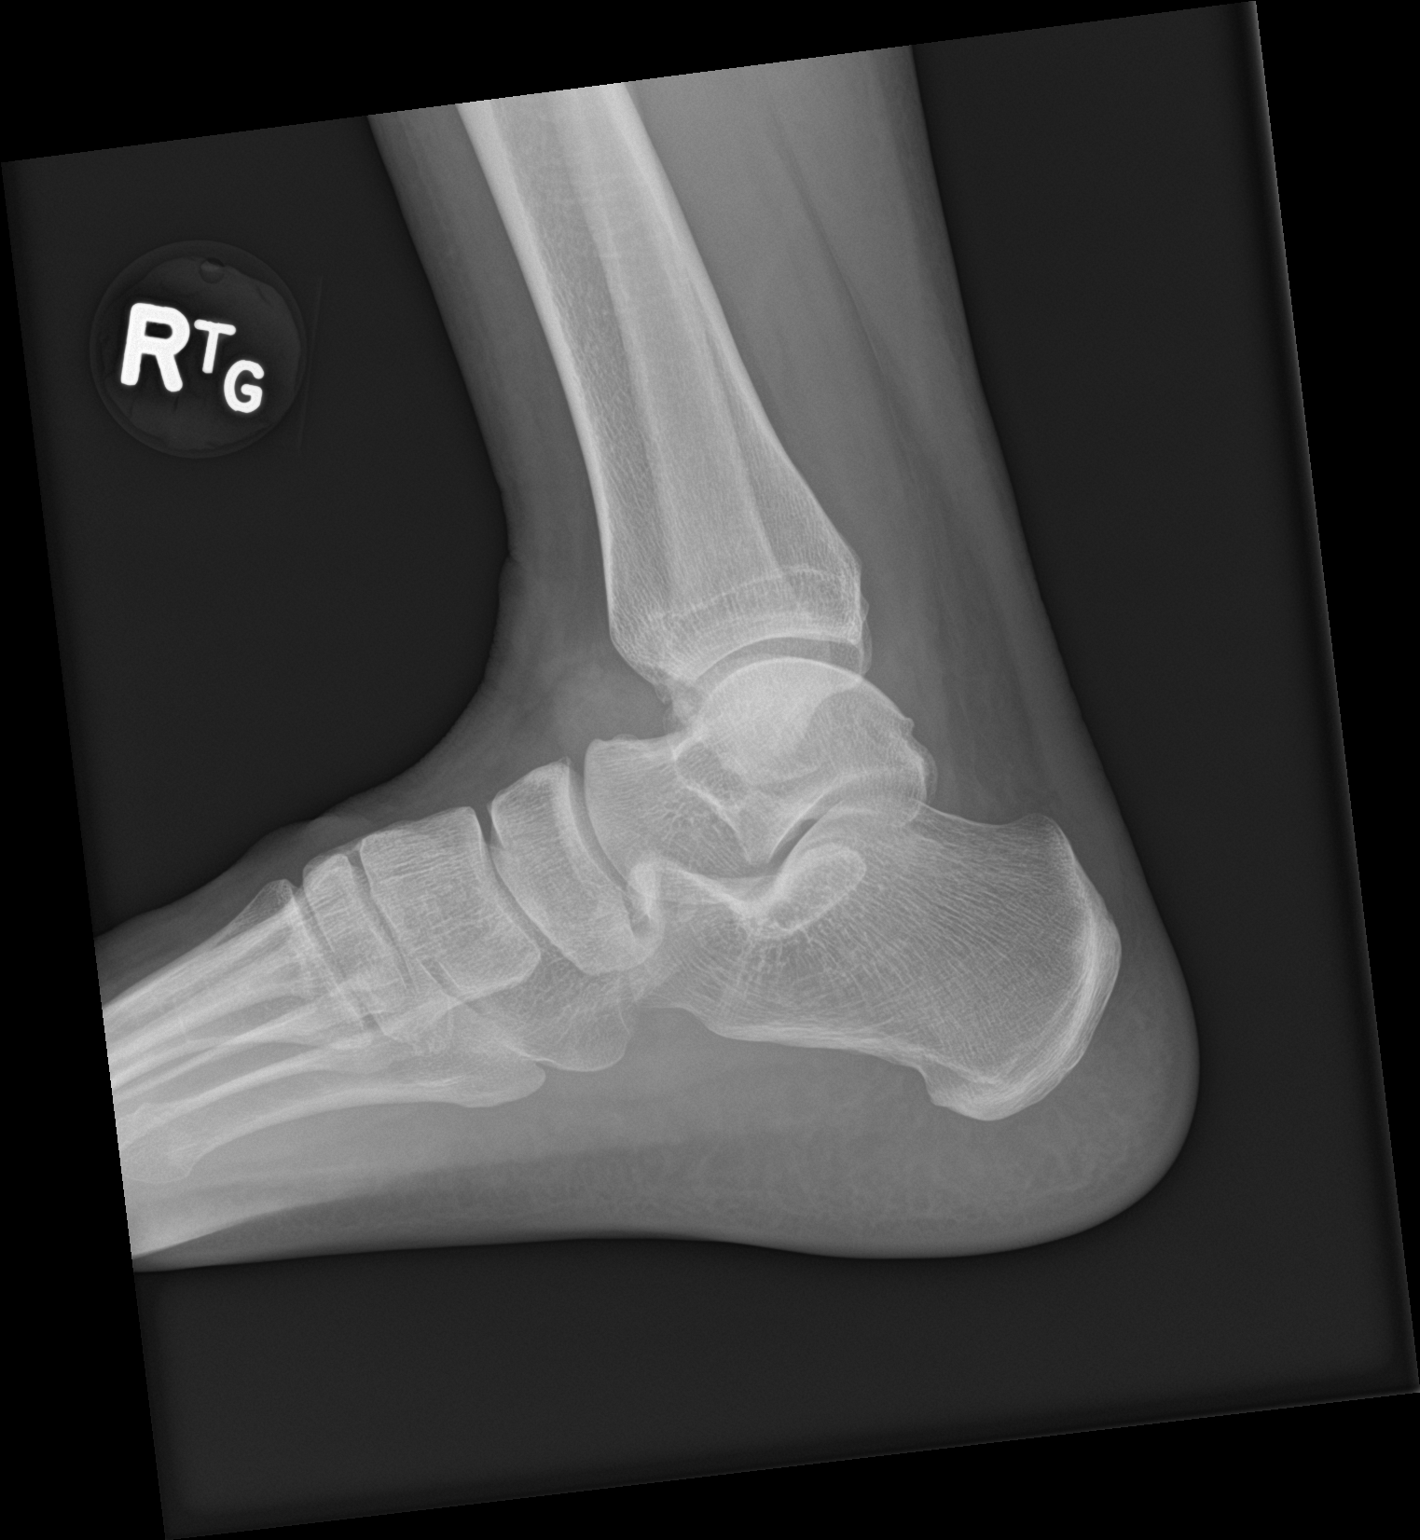

[3 of 3 positions shown; findings below may reference images not displayed]

FINDINGS: Osseous mineralization normal.

Joint spaces preserved.

No acute fracture, dislocation or bone destruction.

Suspect RIGHT ankle joint effusion.
IMPRESSION: Probable RIGHT ankle joint effusion without acute fracture or
dislocation.

## 2017-12-23 ENCOUNTER — Emergency Department
Admission: EM | Admit: 2017-12-23 | Discharge: 2017-12-23 | Disposition: A | Discharge status: Home / Self Care | Payer: BLUE CROSS/BLUE SHIELD | Source: Home / Self Care

## 2017-12-23 ENCOUNTER — Encounter: Payer: Self-pay | Admitting: Emergency Medicine

## 2017-12-23 ENCOUNTER — Other Ambulatory Visit: Payer: Self-pay

## 2017-12-23 DIAGNOSIS — N3090 Cystitis, unspecified without hematuria: Secondary | ICD-10-CM | POA: Diagnosis not present

## 2017-12-23 DIAGNOSIS — R300 Dysuria: Secondary | ICD-10-CM | POA: Diagnosis not present

## 2017-12-23 LAB — POCT URINALYSIS DIP (MANUAL ENTRY)
Bilirubin, UA: NEGATIVE
GLUCOSE UA: NEGATIVE mg/dL
Ketones, POC UA: NEGATIVE mg/dL
Nitrite, UA: NEGATIVE
PH UA: 5.5 (ref 5.0–8.0)
Protein Ur, POC: NEGATIVE mg/dL
SPEC GRAV UA: 1.025 (ref 1.010–1.025)
UROBILINOGEN UA: 1 U/dL

## 2017-12-23 MED ORDER — PHENAZOPYRIDINE HCL 200 MG PO TABS: 200.0000 mg | ORAL_TABLET | Freq: Three times a day (TID) | ORAL | 0 refills | Status: DC

## 2017-12-23 MED ORDER — SULFAMETHOXAZOLE-TRIMETHOPRIM 800-160 MG PO TABS: ORAL_TABLET | ORAL | 0 refills | Status: DC

## 2017-12-23 NOTE — Discharge Instructions (Addendum)
Drink plenty of fluids  Take the sulfamethoxazole 1 twice daily with food as an antibiotic for the infection.  If after 3 days you are completely well you do not have to take the full 5 days, discard the unused medication.  Pyridium is a medicine which simply numbs the bladder so you do not have the burning discomfort.  You can take 1 pill 3 times daily as needed.  It would cause the urine to have a dark orange-brown color, and can stain white clothing.  You do not need to take this if symptoms have improved.  It is good to keep any remaining pills so you can start taking it if you have future urinary tract infections, but since it does not cure the infection you still need to go see a doctor but it will help your symptoms until you get to the doctor.  Return if worse or further concerns.

## 2017-12-23 NOTE — ED Triage Notes (Signed)
42 y.o female presents to the clinic today complaining of dysuria that began last night. She states she is having frequency, burning, and odor. She denies hematuria.

## 2017-12-23 NOTE — ED Provider Notes (Signed)
[facility]    CSN: [id number] Arrival date & time: 12/23/17  1448     History   Chief Complaint Chief Complaint  Patient presents with  . Dysuria    started last night    HPI Morgan Kelley is a 42 y.o. female.   HPI Patient had noted a tiny bit of discomfort in the urinary stream over the last few days, but her main symptoms started last night with urinary frequency and a hot burning sensation.  She had to get up extra times in the night.  She came on in today to get checked.  She did have a history of occasional UTIs in the past, with the last one almost going into her kidneys and she was getting pretty sick.  She came on in early this time because she did not want to harm her kidneys.  She has no allergies, no major illnesses.  Last intercourse was about a week ago. Past Medical History:  Diagnosis Date  . Thyroid disease    not taking meds due to problems with them    Patient Active Problem List   Diagnosis Date Noted  . Left knee pain 03/17/2015    Past Surgical History:  Procedure Laterality Date  . KNEE SURGERY      OB History   None      Home Medications    Prior to Admission medications   Medication Sig Start Date End Date Taking? Authorizing Provider  cetirizine (ZYRTEC) 10 MG tablet Take 1 tablet (10 mg total) by mouth daily. 04/16/17   Lurene Shadow, PA-C  Elastic Bandages & Supports (AIRCAST SPORT ANKLE BRACE/RGHT) MISC Stirrup splint:  Wear daily for 2 to 3 weeks until healed. 03/03/17   [name], MD  phenazopyridine (PYRIDIUM) 200 MG tablet Take 1 tablet (200 mg total) by mouth 3 (three) times daily. 12/23/17   Peyton Najjar, MD  predniSONE (DELTASONE) 20 MG tablet 3 tabs po day one, then 2 po daily x 4 days 04/16/17   Lurene Shadow, PA-C  sulfamethoxazole-trimethoprim (BACTRIM DS,SEPTRA DS) 800-160 MG tablet Take 1 twice daily for urinary tract infection. 12/23/17   Peyton Najjar, MD    Family History Family History    Problem Relation Age of Onset  . Diabetes Mother   . Diabetes Father     Social History Social History   Tobacco Use  . Smoking status: Never Smoker  . Smokeless tobacco: Never Used  Substance Use Topics  . Alcohol use: No  . Drug use: No     Allergies   Patient has no known allergies.   Review of Systems Review of Systems Constitutional: Unremarkable GI: Unremarkable GU: As above No other complaints  Physical Exam Triage Vital Signs ED Triage Vitals [12/23/17 1502]  Enc Vitals Group     BP 111/77     Pulse Rate 85     Resp      Temp 98.4 F (36.9 C)     Temp Source Oral     SpO2 99 %     Weight 174 lb 12.8 oz (79.3 kg)     Height  (1.6 m)     Head Circumference      Peak Flow      Pain Score 0     Pain Loc      Pain Edu?      Excl. in GC?    No data found.  Updated Vital Signs BP 111/77 (  BP Location: Right Arm)   Pulse 85   Temp 98.4 F (36.9 C) (Oral)   Ht  (1.6 m)   Wt 174 lb 12.8 oz (79.3 kg)   SpO2 99%   BMI 30.96 kg/m   Visual Acuity Right Eye Distance:   Left Eye Distance:   Bilateral Distance:    Right Eye Near:   Left Eye Near:    Bilateral Near:     Physical Exam  Healthy-appearing lady, alert and oriented.  No CVA tenderness.  Abdomen soft without mass or tenderness.  No suprapubic tenderness.  Genital exam not done. UC Treatments / Results  Labs (all labs ordered are listed, but only abnormal results are displayed) Labs Reviewed  POCT URINALYSIS DIP (MANUAL ENTRY) - Abnormal; Notable for the following components:      Result Value   Blood, UA trace-lysed (*)    Leukocytes, UA Small (1+) (*)    All other components within normal limits  URINE CULTURE      EKG None  Radiology No results found.  Procedures Procedures (including critical care time)  Medications Ordered in UC Medications - No data to display  Initial Impression / Assessment and Plan / UC Course  I have reviewed the triage vital  signs and the nursing notes.  Pertinent labs & imaging results that were available during my care of the patient were reviewed by me and considered in my medical decision making (see chart for details).     Urinalysis only has a small amount of leukocytes and nitrite negative.  However her symptoms are consistent with a cystitis.  She has been drinking cranberry juice that may have helped abate. Final Clinical Impressions(s) / UC Diagnoses   Final diagnoses:  Cystitis  Dysuria     Discharge Instructions     Drink plenty of fluids  Take the sulfamethoxazole 1 twice daily with food as an antibiotic for the infection.  If after 3 days you are completely well you do not have to take the full 5 days, discard the unused medication.  Pyridium is a medicine which simply numbs the bladder so you do not have the burning discomfort.  You can take 1 pill 3 times daily as needed.  It would cause the urine to have a dark orange-brown color, and can stain white clothing.  You do not need to take this if symptoms have improved.  It is good to keep any remaining pills so you can start taking it if you have future urinary tract infections, but since it does not cure the infection you still need to go see a doctor but it will help your symptoms until you get to the doctor.  Return if worse or further concerns.    ED Prescriptions    Medication Sig Dispense Auth. Provider   sulfamethoxazole-trimethoprim (BACTRIM DS,SEPTRA DS) 800-160 MG tablet Take 1 twice daily for urinary tract infection. 10 tablet Peyton Najjar, MD   phenazopyridine (PYRIDIUM) 200 MG tablet Take 1 tablet (200 mg total) by mouth 3 (three) times daily. 6 tablet Peyton Najjar, MD     Controlled Substance Prescriptions [place] Controlled Substance Registry consulted? No   Peyton Najjar, MD 12/23/17 1530

## 2017-12-25 LAB — URINE CULTURE
MICRO NUMBER:: 90580621
SPECIMEN QUALITY:: ADEQUATE

## 2017-12-26 ENCOUNTER — Telehealth: Payer: Self-pay | Admitting: Emergency Medicine

## 2017-12-26 NOTE — Telephone Encounter (Signed)
Culture grew e-coli, finish all meds, increase water intake.

## 2017-12-26 NOTE — Telephone Encounter (Signed)
Unable to leave message,mailbox full. 

## 2020-08-03 ENCOUNTER — Other Ambulatory Visit: Payer: Self-pay

## 2020-08-03 ENCOUNTER — Emergency Department
Admission: EM | Admit: 2020-08-03 | Discharge: 2020-08-03 | Disposition: A | Discharge status: Home / Self Care | Payer: BLUE CROSS/BLUE SHIELD | Source: Home / Self Care

## 2020-08-03 ENCOUNTER — Encounter: Payer: Self-pay | Admitting: Family Medicine

## 2020-08-03 DIAGNOSIS — J069 Acute upper respiratory infection, unspecified: Secondary | ICD-10-CM

## 2020-08-03 MED ORDER — PREDNISONE 50 MG PO TABS: ORAL_TABLET | ORAL | 0 refills | Status: DC

## 2020-08-03 MED ORDER — PREDNISONE 50 MG PO TABS: ORAL_TABLET | ORAL | 0 refills | Status: AC

## 2020-08-03 NOTE — Discharge Instructions (Addendum)
Hoarseness is always caused by a virus. The hoarseness is related to swelling and inflammation in the vocal cords. The medicine prescribed should help the vocal cords recover and you should be feeling better by Friday.

## 2020-08-03 NOTE — ED Triage Notes (Signed)
Pt presents to [facility] reports having recurrent sinus issues, however yesterday symptoms worsened. She woke up with her voice hoarse and worsening nasal drainage. Pt has been taking mucinex otc w/o any improvement. Pt is not vaccinated for flu nor covid.

## 2020-08-03 NOTE — ED Provider Notes (Signed)
[facility]    CSN: [id number] Arrival date & time: 08/03/20  0935      History   Chief Complaint Chief Complaint  Patient presents with  . Cough  . Nasal Congestion  . Sore Throat    HPI Morgan Kelley is a 44 y.o. female.   This is an established [place] urgent care patient comes in complaining of sore throat, sinus congestion, and cough.  Pt presents to [facility] reports having recurrent sinus issues, however yesterday symptoms worsened. She woke up with her voice hoarse and worsening nasal drainage. Pt has been taking mucinex otc w/o any improvement. Pt is not vaccinated for flu nor covid.   Patient woke up with the hoarseness today. This is her main complaint.     Past Medical History:  Diagnosis Date  . Thyroid disease    not taking meds due to problems with them    Patient Active Problem List   Diagnosis Date Noted  . Left knee pain 03/17/2015    Past Surgical History:  Procedure Laterality Date  . CYST EXCISION Left    breast  . KNEE SURGERY    . OOPHORECTOMY Left     OB History   No obstetric history on file.      Home Medications    Prior to Admission medications   Medication Sig Start Date End Date Taking? Authorizing Provider  norgestimate-ethinyl estradiol (SPRINTEC 28) 0.25-35 MG-MCG tablet  03/14/15  Yes [provider]  levothyroxine (SYNTHROID) 25 MCG tablet levothyroxine 25 mcg tablet    [provider]  oxyCODONE-acetaminophen (PERCOCET/ROXICET) 5-325 MG tablet oxycodone-acetaminophen 5 mg-325 mg tablet  TAKE 1 TABLET BY MOUTH EVERY SIX HOURS AS NEEDED FOR PAIN.    [provider]  predniSONE (DELTASONE) 50 MG tablet 1 tablet daily with food 08/03/20   Elvina Sidle, MD    Family History Family History  Problem Relation Age of Onset  . Diabetes Mother   . Diabetes Father     Social History Social History   Tobacco Use  . Smoking status: Never Smoker  . Smokeless tobacco: Never Used   Vaping Use  . Vaping Use: Never used  Substance Use Topics  . Alcohol use: No  . Drug use: No     Allergies   Patient has no known allergies.   Review of Systems Review of Systems  Constitutional: Negative.   HENT: Positive for congestion and sore throat.   Respiratory: Positive for cough.   Gastrointestinal: Negative.      Physical Exam Triage Vital Signs ED Triage Vitals  Enc Vitals Group     BP      Pulse      Resp      Temp      Temp src      SpO2      Weight      Height      Head Circumference      Peak Flow      Pain Score      Pain Loc      Pain Edu?      Excl. in GC?    No data found.  Updated Vital Signs BP 128/85 (BP Location: Left Arm)   Pulse 85   Temp 98 F (36.7 C) (Oral)   Resp 17   Ht 5\' 3"  (1.6 m)   Wt 77.1 kg   LMP 07/13/2020 (Approximate)   SpO2 98%   BMI 30.11 kg/m    Physical Exam  Vitals and nursing note reviewed.  Constitutional:      Appearance: She is well-developed. She is obese.  HENT:     Head: Normocephalic.     Mouth/Throat:     Mouth: Mucous membranes are moist.     Pharynx: Oropharynx is clear.  Pulmonary:     Effort: Pulmonary effort is normal.  Musculoskeletal:     Cervical back: Normal range of motion and neck supple.  Skin:    General: Skin is warm and dry.  Neurological:     General: No focal deficit present.     Mental Status: She is alert.  Psychiatric:        Mood and Affect: Mood normal.      UC Treatments / Results  Labs (all labs ordered are listed, but only abnormal results are displayed) Labs Reviewed - No data to display  EKG   Radiology No results found.  Procedures Procedures (including critical care time)  Medications Ordered in UC Medications - No data to display  Initial Impression / Assessment and Plan / UC Course  I have reviewed the triage vital signs and the nursing notes.  Pertinent labs & imaging results that were available during my care of the patient were  reviewed by me and considered in my medical decision making (see chart for details).    Final Clinical Impressions(s) / UC Diagnoses   Final diagnoses:  Viral upper respiratory tract infection     Discharge Instructions     Hoarseness is always caused by a virus. The hoarseness is related to swelling and inflammation in the vocal cords. The medicine prescribed should help the vocal cords recover and you should be feeling better by Friday.    ED Prescriptions    Medication Sig Dispense Auth. Provider   predniSONE (DELTASONE) 50 MG tablet 1 tablet daily with food 5 tablet Elvina Sidle, MD     I have reviewed the PDMP during this encounter.   Elvina Sidle, MD 08/03/20 1002

## 2020-08-05 ENCOUNTER — Telehealth: Payer: Self-pay | Admitting: Emergency Medicine

## 2020-08-05 ENCOUNTER — Telehealth: Payer: Self-pay | Admitting: Family Medicine

## 2020-08-05 MED ORDER — AMOXICILLIN 875 MG PO TABS: 875.0000 mg | ORAL_TABLET | Freq: Two times a day (BID) | ORAL | 0 refills | Status: AC

## 2020-08-05 MED ORDER — MAGIC MOUTHWASH W/LIDOCAINE: 10.0000 mL | ORAL | 0 refills | Status: AC | PRN

## 2020-08-05 NOTE — Telephone Encounter (Signed)
Call back to pt to let her know that Dr Milus Glazier would call her in something - confirmed [facility] on [address] & no allergies- pt continues to complain of sinus pressure & headache

## 2020-08-05 NOTE — Telephone Encounter (Signed)
Patient complains of sinus pressure

## 2020-08-05 NOTE — Telephone Encounter (Signed)
Hoarseness continues with sore throat despite prednisone

## 2020-08-05 NOTE — Telephone Encounter (Signed)
Patient now says hoarseness has resolved but is having sinus pressure and discomfort.

## 2020-08-07 ENCOUNTER — Telehealth: Payer: Self-pay | Admitting: Emergency Medicine

## 2020-08-07 NOTE — Telephone Encounter (Signed)
Pt calling in to extend work excuse - pt has not started antibiotic prescribed because she was concerned about side effects- COVID test done outside of [facility] on Friday was negative per Byrd Hesselbach- spoke w/ provider  ( Dr Milus Glazier) here today. Pt excused from work on 12/27- may return on 12/28- Netty verbalized and understanding that she should start antibiotics today.
